# Patient Record
Sex: Female | Born: 1985 | Race: White | Hispanic: No | Smoking: Never smoker
Health system: Southern US, Community
[De-identification: ages and names within clinical notes are randomized; demographics above are authoritative.]

## PROBLEM LIST (undated history)

## (undated) DIAGNOSIS — J45909 Unspecified asthma, uncomplicated: Secondary | ICD-10-CM

## (undated) DIAGNOSIS — F32A Depression, unspecified: Secondary | ICD-10-CM

## (undated) DIAGNOSIS — F329 Major depressive disorder, single episode, unspecified: Secondary | ICD-10-CM

## (undated) HISTORY — PX: TONSILLECTOMY: SUR1361

## (undated) HISTORY — PX: RENAL CYST EXCISION: SUR506

## (undated) HISTORY — PX: ADENOIDECTOMY: SUR15

---

## 2010-09-08 ENCOUNTER — Emergency Department: Payer: Self-pay | Admitting: Emergency Medicine

## 2011-01-20 ENCOUNTER — Emergency Department: Payer: Self-pay

## 2011-10-14 ENCOUNTER — Emergency Department: Payer: Self-pay | Admitting: Unknown Physician Specialty

## 2012-07-30 ENCOUNTER — Ambulatory Visit: Payer: Self-pay | Admitting: Family Medicine

## 2012-08-05 ENCOUNTER — Ambulatory Visit: Payer: Self-pay | Admitting: Unknown Physician Specialty

## 2012-08-29 ENCOUNTER — Emergency Department: Payer: Self-pay | Admitting: Emergency Medicine

## 2012-09-08 ENCOUNTER — Emergency Department: Payer: Self-pay | Admitting: Emergency Medicine

## 2013-01-06 ENCOUNTER — Emergency Department: Payer: Self-pay | Admitting: Emergency Medicine

## 2013-01-07 LAB — URINALYSIS, COMPLETE
Bilirubin,UR: NEGATIVE
Blood: NEGATIVE
Glucose,UR: NEGATIVE mg/dL (ref 0–75)
Protein: NEGATIVE
Squamous Epithelial: 3

## 2013-01-07 LAB — COMPREHENSIVE METABOLIC PANEL
Alkaline Phosphatase: 136 U/L (ref 50–136)
Anion Gap: 4 — ABNORMAL LOW (ref 7–16)
BUN: 10 mg/dL (ref 7–18)
Bilirubin,Total: 0.4 mg/dL (ref 0.2–1.0)
Calcium, Total: 9.4 mg/dL (ref 8.5–10.1)
Chloride: 108 mmol/L — ABNORMAL HIGH (ref 98–107)
Creatinine: 0.98 mg/dL (ref 0.60–1.30)
EGFR (Non-African Amer.): >60
Glucose: 87 mg/dL (ref 65–99)
SGPT (ALT): 26 U/L (ref 12–78)
Sodium: 140 mmol/L (ref 136–145)
Total Protein: 7.3 g/dL (ref 6.4–8.2)

## 2013-01-07 LAB — CBC
HCT: 38 % (ref 35.0–47.0)
HGB: 13.2 g/dL (ref 12.0–16.0)
MCH: 30.3 pg (ref 26.0–34.0)

## 2013-01-07 LAB — TROPONIN I: Troponin-I: <0.02 ng/mL

## 2013-02-03 ENCOUNTER — Ambulatory Visit: Payer: Self-pay | Admitting: Gastroenterology

## 2013-02-17 ENCOUNTER — Emergency Department: Payer: Self-pay | Admitting: Emergency Medicine

## 2013-02-17 LAB — URINALYSIS, COMPLETE
Bilirubin,UR: NEGATIVE
Glucose,UR: NEGATIVE mg/dL (ref 0–75)
Ph: 5 (ref 4.5–8.0)
Protein: NEGATIVE
Specific Gravity: 1.028 (ref 1.003–1.030)
Squamous Epithelial: 8

## 2013-02-17 LAB — COMPREHENSIVE METABOLIC PANEL
Albumin: 3.5 g/dL (ref 3.4–5.0)
Anion Gap: 5 — ABNORMAL LOW (ref 7–16)
BUN: 11 mg/dL (ref 7–18)
Calcium, Total: 9 mg/dL (ref 8.5–10.1)
Chloride: 107 mmol/L (ref 98–107)
Co2: 24 mmol/L (ref 21–32)
Creatinine: 0.92 mg/dL (ref 0.60–1.30)
EGFR (Non-African Amer.): >60
Osmolality: 271 (ref 275–301)
Potassium: 3.3 mmol/L — ABNORMAL LOW (ref 3.5–5.1)
SGOT(AST): 30 U/L (ref 15–37)
Sodium: 136 mmol/L (ref 136–145)

## 2013-02-17 LAB — CBC
HCT: 41.5 % (ref 35.0–47.0)
MCH: 29 pg (ref 26.0–34.0)
MCHC: 33.1 g/dL (ref 32.0–36.0)
MCV: 88 fL (ref 80–100)
Platelet: 170 10*3/uL (ref 150–440)
RBC: 4.74 10*6/uL (ref 3.80–5.20)
RDW: 13.1 % (ref 11.5–14.5)
WBC: 10.8 10*3/uL (ref 3.6–11.0)

## 2013-07-25 ENCOUNTER — Emergency Department (HOSPITAL_BASED_OUTPATIENT_CLINIC_OR_DEPARTMENT_OTHER): Payer: Worker's Compensation

## 2013-07-25 ENCOUNTER — Encounter (HOSPITAL_BASED_OUTPATIENT_CLINIC_OR_DEPARTMENT_OTHER): Payer: Self-pay | Admitting: Emergency Medicine

## 2013-07-25 ENCOUNTER — Emergency Department (HOSPITAL_BASED_OUTPATIENT_CLINIC_OR_DEPARTMENT_OTHER)
Admission: EM | Admit: 2013-07-25 | Discharge: 2013-07-25 | Disposition: A | Discharge status: Home / Self Care | Payer: Worker's Compensation | Attending: Emergency Medicine | Admitting: Emergency Medicine

## 2013-07-25 DIAGNOSIS — Y9239 Other specified sports and athletic area as the place of occurrence of the external cause: Secondary | ICD-10-CM | POA: Insufficient documentation

## 2013-07-25 DIAGNOSIS — S0510XA Contusion of eyeball and orbital tissues, unspecified eye, initial encounter: Secondary | ICD-10-CM | POA: Insufficient documentation

## 2013-07-25 DIAGNOSIS — S0003XA Contusion of scalp, initial encounter: Secondary | ICD-10-CM | POA: Insufficient documentation

## 2013-07-25 DIAGNOSIS — S0990XA Unspecified injury of head, initial encounter: Secondary | ICD-10-CM | POA: Insufficient documentation

## 2013-07-25 DIAGNOSIS — Z79899 Other long term (current) drug therapy: Secondary | ICD-10-CM | POA: Insufficient documentation

## 2013-07-25 DIAGNOSIS — F329 Major depressive disorder, single episode, unspecified: Secondary | ICD-10-CM | POA: Insufficient documentation

## 2013-07-25 DIAGNOSIS — S1093XA Contusion of unspecified part of neck, initial encounter: Secondary | ICD-10-CM

## 2013-07-25 DIAGNOSIS — S0083XA Contusion of other part of head, initial encounter: Secondary | ICD-10-CM | POA: Insufficient documentation

## 2013-07-25 DIAGNOSIS — S060XAA Concussion with loss of consciousness status unknown, initial encounter: Secondary | ICD-10-CM | POA: Insufficient documentation

## 2013-07-25 DIAGNOSIS — F3289 Other specified depressive episodes: Secondary | ICD-10-CM | POA: Insufficient documentation

## 2013-07-25 DIAGNOSIS — W219XXA Striking against or struck by unspecified sports equipment, initial encounter: Secondary | ICD-10-CM | POA: Insufficient documentation

## 2013-07-25 DIAGNOSIS — J45909 Unspecified asthma, uncomplicated: Secondary | ICD-10-CM | POA: Insufficient documentation

## 2013-07-25 DIAGNOSIS — Y92838 Other recreation area as the place of occurrence of the external cause: Secondary | ICD-10-CM

## 2013-07-25 DIAGNOSIS — S060X9A Concussion with loss of consciousness of unspecified duration, initial encounter: Secondary | ICD-10-CM

## 2013-07-25 DIAGNOSIS — Y9367 Activity, basketball: Secondary | ICD-10-CM | POA: Insufficient documentation

## 2013-07-25 HISTORY — DX: Unspecified asthma, uncomplicated: J45.909

## 2013-07-25 HISTORY — DX: Depression, unspecified: F32.A

## 2013-07-25 HISTORY — DX: Major depressive disorder, single episode, unspecified: F32.9

## 2013-07-25 MED ORDER — ONDANSETRON 4 MG PO TBDP: 4.0000 mg | ORAL_TABLET | Freq: Three times a day (TID) | ORAL | Status: DC | PRN

## 2013-07-25 MED ORDER — NAPROXEN 500 MG PO TABS: 500.0000 mg | ORAL_TABLET | Freq: Two times a day (BID) | ORAL | Status: DC

## 2013-07-25 NOTE — Discharge Instructions (Signed)
Concussion, Adult  A concussion is a brain injury. It is caused by:  · A hit to the head.  · A quick and sudden movement (jolt) of the head or neck.  A concussion is usually not life-threatening. Even so, it can cause serious problems. If you had a concussion before, you may have concussion-like problems after a hit to your head.  HOME CARE  General Instructions  · Follow your doctor's directions carefully.  · Take medicines only as told by your doctor.  · Only take medicines your doctor says are safe.  · Do not drink alcohol until your doctor says it is OK. Alcohol and some drugs can slow down healing. They can also put you at risk for further injury.  · If your are having trouble remembering things, write them down.  · Try to do one thing at a time if you get distracted easily. For example, do not watch TV while making dinner.  · Talk to your family members or close friends when making important decisions.  · Follow up with your doctor as told.  · Watch your symptoms. Tell others to do the same. Serious problems can sometimes happen after a concussion. Older adults are more likely to have these problems.  · Tell your teachers, school nurse, school counselor, coach, athletic trainer, or work manager about your concussion. Tell them about what you can or cannot do. They should watch to see if:  · It gets even harder for you to pay attention or concentrate.  · It gets even harder for you to remember things or learn new things.  · You need more time than normal to finish things.  · You become annoyed (irritable) more than before.  · You are not able to deal with stress as well.  · You have more problems than before.  · Rest. Make sure you:  · Get plenty of sleep at night.  · Go to sleep early.  · Go to bed at the same time every day. Try to wake up at the same time.  · Rest during the day.  · Take naps when you feel tired.  · Limit activities where you have to think a lot or concentrate. These include:  · Doing  homework.  · Doing work related to a job.  · Watching TV.  · Using the computer.  Returning To Your Regular Activities  Return to your normal activities slowly, not all at once. You must give your body and brain enough time to heal.   · Do not play sports or do other athletic activities until your doctor says it is OK.  · Ask your doctor when you can drive, ride a bicycle, or work other vehicles or machines. Never do these things if you feel dizzy.  · Ask your doctor about when you can return to work or school.  Preventing Another Concussion  It is very important to avoid another brain injury, especially before you have healed. In rare cases, another injury can lead to permanent brain damage, brain swelling, or death. The risk of this is greatest during the first 7 10 after your injury. Avoid injuries by:   · Wearing a seat belt when riding in a car.  · Not drinking too much alcohol.  · Avoiding activities that could lead to a second concussion (such as contact sports).  · Wearing a helmet when doing activities like:  · Biking.  · Skiing.  · Skateboarding.  · Skating.  ·   Making your home safer by:  · Removing things from the floor or stairways that could make you trip.  · Using grab bars in bathrooms and handrails by stairs.  · Placing non-slip mats on floors and in bathtubs.  · Improve lighting in dark areas.  GET HELP IF:  · It gets even harder for you to pay attention or concentrate.  · It gets even harder for you to remember things or learn new things.  · You need more time than normal to finish things.  · You become annoyed (irritable) more than before.  · You are not able to deal with stress as well.  · You have more problems than before.  · You have problems keeping your balance.  · You are not able to react quickly when you should.  Get help if you have any of these problems for more than 2 weeks:   · Lasting (chronic) headaches.  · Dizziness or trouble balancing.  · Feeling sick to your stomach  (nausea).  · Seeing (vision) problems.  · Being affected by noises or light more than normal.  · Feeling sad, low, down in the dumps, blue, gloomy, or empty (depressed).  · Mood changes (mood swings).  · Feeling of fear or nervousness about what may happen (anxiety).  · Feeling annoyed.  · Memory problems.  · Problems concentrating or paying attention.  · Sleep problems.  · Feeling tired all the time.  GET HELP RIGHT AWAY IF:   · You have bad headaches or your headaches get worse.  · You have weakness (even if it is in one hand, leg, or part of the face).  · You have loss of feeling (numbness).  · You feel off balance.  · You keep throwing up (vomiting).  · You feel tired.  · One black center of your eye (pupil) is larger than the other.  · You twitch or shake violently (convulse).  · Your speech is not clear (slurred).  · You are more confused, easily angered (agitated), or annoyed than before.  · You have more trouble resting than before.  · You are unable to recognize people or places.  · You have neck pain.  · It is difficult to wake you up.  · You have unusual behavior changes.  · You pass out (lose consciousness).  MAKE SURE YOU:   · Understand these instructions.  · Will watch your condition.  · Will get help right away if you are not doing well or get worse.  Document Released: 02/01/2009 Document Revised: 12/04/2012 Document Reviewed: 09/05/2012  [employer]® Patient Information ©2014 [employer].

## 2013-07-25 NOTE — ED Notes (Signed)
Pt c/o head injury x 2 hrs ago , hit to head by baskettball, c/o dizziness

## 2013-07-25 NOTE — ED Provider Notes (Signed)
CSN: [id number]     Arrival date & time 07/25/13  1412 History   First MD Initiated Contact with Patient 07/25/13 1506     Chief Complaint  Patient presents with  . Head Injury     (Consider location/radiation/quality/duration/timing/severity/associated sxs/prior Treatment) Patient is a 28 y.o. female presenting with head injury. The history is provided by the patient.  Head Injury Associated symptoms: headache   Associated symptoms: no nausea, no neck pain, no tinnitus and no vomiting    patient was at work. Patient was struck by basketball on the right for head right face area. No loss of consciousness but had dizziness. Has some mild discomfort. But no vision changes. The eye pain is very mild. Patient also has a mild headache. No neck pain. No other injuries.  Past Medical History  Diagnosis Date  . Asthma   . Depressed    Past Surgical History  Procedure Laterality Date  . Tonsillectomy     History reviewed. No pertinent family history. History  Substance Use Topics  . Smoking status: Never Smoker   . Smokeless tobacco: Not on file  . Alcohol Use: No   OB History   Grav Para Term Preterm Abortions TAB SAB Ect Mult Living                 Review of Systems  Constitutional: Negative for fever.  HENT: Positive for facial swelling. Negative for congestion, ear pain, nosebleeds, tinnitus, trouble swallowing and voice change.   Eyes: Positive for pain. Negative for photophobia, redness and visual disturbance.  Respiratory: Negative for shortness of breath.   Cardiovascular: Negative for chest pain.  Gastrointestinal: Negative for nausea, vomiting and abdominal pain.  Genitourinary: Negative for dysuria.  Musculoskeletal: Negative for back pain and neck pain.  Skin: Negative for rash and wound.  Neurological: Positive for dizziness and headaches. Negative for weakness.  Hematological: Does not bruise/bleed easily.  Psychiatric/Behavioral: Negative for confusion.       Allergies  Ceclor and Erythromycin  Home Medications   Prior to Admission medications   Medication Sig Start Date End Date Taking? Authorizing Provider  albuterol (PROVENTIL) (2.5 MG/3ML) 0.083% nebulizer solution Take 2.5 mg by nebulization every 6 (six) hours as needed for wheezing or shortness of breath.   Yes Historical Provider, MD  buPROPion (WELLBUTRIN XL) 300 MG 24 hr tablet Take 300 mg by mouth daily.   Yes Historical Provider, MD  naproxen (NAPROSYN) 500 MG tablet Take 1 tablet (500 mg total) by mouth 2 (two) times daily. 07/25/13   Vanetta Mulders, MD  ondansetron (ZOFRAN ODT) 4 MG disintegrating tablet Take 1 tablet (4 mg total) by mouth every 8 (eight) hours as needed for nausea or vomiting. 07/25/13   Vanetta Mulders, MD   BP 148/90  Pulse 73  Temp(Src) 98.5 F (36.9 C)  Resp 16  Ht 5\' 7"  (1.702 m)  Wt 280 lb (127.007 kg)  BMI 43.84 kg/m2  SpO2 100%  LMP 07/16/2013 Physical Exam  Nursing note and vitals reviewed. Constitutional: She is oriented to person, place, and time. She appears well-developed and well-nourished. No distress.  HENT:  Head: Normocephalic.  Mouth/Throat: Oropharynx is clear and moist.  Mild tenderness to palpation underneath the right eye.  Eyes: Conjunctivae and EOM are normal. Pupils are equal, round, and reactive to light.  No hyphema. No ecchymosis.  Neck: Normal range of motion. Neck supple.  Cardiovascular: Normal rate, regular rhythm and intact distal pulses.   Pulmonary/Chest: Effort normal and breath  sounds normal. No respiratory distress.  Abdominal: Soft. Bowel sounds are normal. There is no tenderness.  Musculoskeletal: Normal range of motion. She exhibits no tenderness.  Neurological: She is alert and oriented to person, place, and time. No cranial nerve deficit. She exhibits normal muscle tone. Coordination normal.  Skin: Skin is warm.    ED Course  Procedures (including critical care time) Labs Review Labs Reviewed -  No data to display  Imaging Review Ct Head Wo Contrast  07/25/2013   CLINICAL DATA:  Hit on right face with basketball.  EXAM: CT HEAD WITHOUT CONTRAST  CT MAXILLOFACIAL WITHOUT CONTRAST  TECHNIQUE: Multidetector CT imaging of the head and maxillofacial structures were performed using the standard protocol without intravenous contrast. Multiplanar CT image reconstructions of the maxillofacial structures were also generated.  COMPARISON:  None.  FINDINGS: CT HEAD FINDINGS  The ventricles and sulci are normal. No intraparenchymal hemorrhage, mass effect nor midline shift. No acute large vascular territory infarcts.  No abnormal extra-axial fluid collections. Basal cisterns are patent. No skull fracture.  No skull fracture. The included ocular globes and orbital contents are non-suspicious. The mastoid aircells and included paranasal sinuses are well-aerated. Partial volume fluid density in the sella.  CT MAXILLOFACIAL FINDINGS  The mandible is intact, the condyles are located. No facial fracture. Orbital walls and rims intact.  No destructive bony lesions. Paranasal sinuses and mastoid air cells are well aerated. Nasal septum slightly deviated to the right. Bilateral accessory parotid tissue, normal.  Ocular globes and orbital contents are unremarkable. Soft tissues are nonsuspicious.  IMPRESSION: CT head: No acute intracranial process ; normal noncontrast CT of the.  CT maxillofacial:  No facial fracture.   Electronically Signed   By: Awilda Metroourtnay  Bloomer   On: 07/25/2013 16:25   Ct Maxillofacial Wo Cm  07/25/2013   CLINICAL DATA:  Hit on right face with basketball.  EXAM: CT HEAD WITHOUT CONTRAST  CT MAXILLOFACIAL WITHOUT CONTRAST  TECHNIQUE: Multidetector CT imaging of the head and maxillofacial structures were performed using the standard protocol without intravenous contrast. Multiplanar CT image reconstructions of the maxillofacial structures were also generated.  COMPARISON:  None.  FINDINGS: CT HEAD  FINDINGS  The ventricles and sulci are normal. No intraparenchymal hemorrhage, mass effect nor midline shift. No acute large vascular territory infarcts.  No abnormal extra-axial fluid collections. Basal cisterns are patent. No skull fracture.  No skull fracture. The included ocular globes and orbital contents are non-suspicious. The mastoid aircells and included paranasal sinuses are well-aerated. Partial volume fluid density in the sella.  CT MAXILLOFACIAL FINDINGS  The mandible is intact, the condyles are located. No facial fracture. Orbital walls and rims intact.  No destructive bony lesions. Paranasal sinuses and mastoid air cells are well aerated. Nasal septum slightly deviated to the right. Bilateral accessory parotid tissue, normal.  Ocular globes and orbital contents are unremarkable. Soft tissues are nonsuspicious.  IMPRESSION: CT head: No acute intracranial process ; normal noncontrast CT of the.  CT maxillofacial:  No facial fracture.   Electronically Signed   By: Awilda Metroourtnay  Bloomer   On: 07/25/2013 16:25     EKG Interpretation None      MDM   Final diagnoses:  Head injury  Concussion  Facial contusion    Patient status post head injury hit by basketball on the right side of the face and head no loss of consciousness. It was dizzy. CT of head and face without any acute injuries. Based on patient's symptoms has a  mild concussion. This occurred at work. Workmen's Comp. paperwork completed. No neck pain. No other injuries.    Vanetta Mulders, MD 07/25/13 1655

## 2013-12-05 ENCOUNTER — Emergency Department: Payer: Self-pay | Admitting: Emergency Medicine

## 2014-03-10 ENCOUNTER — Emergency Department: Payer: Self-pay | Admitting: Emergency Medicine

## 2014-03-10 LAB — COMPREHENSIVE METABOLIC PANEL
ANION GAP: 7 (ref 7–16)
Albumin: 3.2 g/dL — ABNORMAL LOW (ref 3.4–5.0)
Alkaline Phosphatase: 90 U/L
BUN: 10 mg/dL (ref 7–18)
Bilirubin,Total: 0.2 mg/dL (ref 0.2–1.0)
CALCIUM: 8.8 mg/dL (ref 8.5–10.1)
CHLORIDE: 109 mmol/L — AB (ref 98–107)
CO2: 25 mmol/L (ref 21–32)
Creatinine: 0.96 mg/dL (ref 0.60–1.30)
EGFR (Non-African Amer.): >60
GLUCOSE: 86 mg/dL (ref 65–99)
OSMOLALITY: 280 (ref 275–301)
POTASSIUM: 3.5 mmol/L (ref 3.5–5.1)
SGOT(AST): 16 U/L (ref 15–37)
SGPT (ALT): 16 U/L
SODIUM: 141 mmol/L (ref 136–145)
Total Protein: 7.2 g/dL (ref 6.4–8.2)

## 2014-03-10 LAB — TROPONIN I

## 2014-03-10 LAB — CBC
HCT: 40.7 % (ref 35.0–47.0)
HGB: 13.3 g/dL (ref 12.0–16.0)
MCH: 29.4 pg (ref 26.0–34.0)
MCHC: 32.7 g/dL (ref 32.0–36.0)
MCV: 90 fL (ref 80–100)
PLATELETS: 246 10*3/uL (ref 150–440)
RBC: 4.53 10*6/uL (ref 3.80–5.20)
RDW: 13.4 % (ref 11.5–14.5)
WBC: 8.7 10*3/uL (ref 3.6–11.0)

## 2014-03-10 LAB — D-DIMER(ARMC): D-Dimer: 270 ng/ml

## 2014-03-19 ENCOUNTER — Encounter (INDEPENDENT_AMBULATORY_CARE_PROVIDER_SITE_OTHER): Payer: Self-pay

## 2014-03-19 ENCOUNTER — Encounter: Payer: Self-pay | Admitting: Cardiovascular Disease

## 2014-03-19 ENCOUNTER — Ambulatory Visit (INDEPENDENT_AMBULATORY_CARE_PROVIDER_SITE_OTHER): Payer: BLUE CROSS/BLUE SHIELD | Admitting: Cardiovascular Disease

## 2014-03-19 VITALS — BP 139/99 | HR 75 | Ht 67.0 in | Wt 314.0 lb

## 2014-03-19 DIAGNOSIS — R002 Palpitations: Secondary | ICD-10-CM

## 2014-03-19 DIAGNOSIS — R0602 Shortness of breath: Secondary | ICD-10-CM

## 2014-03-19 DIAGNOSIS — R000 Tachycardia, unspecified: Secondary | ICD-10-CM

## 2014-03-19 NOTE — Assessment & Plan Note (Signed)
The patient reports increased episodes of palpitations almost on a daily basis. Some of her symptoms are suggestive of SVT and others of premature beats. The episodes are happening almost on a daily basis and thus I requested a 48-hour Holter monitor. Stress might be contributing to some of her symptoms. She reports no symptoms suggestive of sleep apnea. She does have chronic exertional dyspnea and thus I requested an echocardiogram to ensure no structural heart abnormalities.

## 2014-03-19 NOTE — Progress Notes (Signed)
HPI  This is a 29 year old female who was referred from the emergency room at [facility] for evaluation of palpitations. She has no previous cardiac history. She has known history of asthma and obesity. She has been having increased episodes of palpitations described as sudden increase in heart rate associated with shortness of breath and dizziness. No chest discomfort. No syncope or presyncope. There is no family history of coronary artery disease or arrhythmia. She is not a smoker and does not consume excessive amounts of caffeine. She went to the emergency room at [facility] where basic workup was unremarkable. She reports that these episodes are happening almost on a daily basis. She has been under stress lately due to increased demands of work.  Allergies  Allergen Reactions  . Ceclor [Cefaclor]   . Erythromycin      Current Outpatient Prescriptions on File Prior to Visit  Medication Sig Dispense Refill  . albuterol (PROVENTIL) (2.5 MG/3ML) 0.083% nebulizer solution Take 2.5 mg by nebulization every 6 (six) hours as needed for wheezing or shortness of breath.     No current facility-administered medications on file prior to visit.     Past Medical History  Diagnosis Date  . Asthma   . Depressed      Past Surgical History  Procedure Laterality Date  . Tonsillectomy    . Adenoidectomy    . Renal cyst excision       Family History  Problem Relation Age of Onset  . Heart attack Mother   . Hypertension Mother   . Hyperlipidemia Mother      History   Social History  . Marital Status: Unknown    Spouse Name: N/A    Number of Children: N/A  . Years of Education: N/A   Occupational History  . Not on file.   Social History Main Topics  . Smoking status: Never Smoker   . Smokeless tobacco: Not on file  . Alcohol Use: Yes     Comment: occasional  . Drug Use: No  . Sexual Activity: No   Other Topics Concern  . Not on file   Social History Narrative     ROS A 10  point review of system was performed. It is negative other than that mentioned in the history of present illness.   PHYSICAL EXAM   BP 139/99 mmHg  Pulse 75  Ht 5\' 7"  (1.702 m)  Wt 314 lb (142.429 kg)  BMI 49.17 kg/m2 Constitutional: She is oriented to person, place, and time. She appears well-developed and well-nourished. No distress.  HENT: No nasal discharge.  Head: Normocephalic and atraumatic.  Eyes: Pupils are equal and round. No discharge.  Neck: Normal range of motion. Neck supple. No JVD present. No thyromegaly present.  Cardiovascular: Normal rate, regular rhythm, normal heart sounds. Exam reveals no gallop and no friction rub. No murmur heard.  Pulmonary/Chest: Effort normal and breath sounds normal. No stridor. No respiratory distress. She has no wheezes. She has no rales. She exhibits no tenderness.  Abdominal: Soft. Bowel sounds are normal. She exhibits no distension. There is no tenderness. There is no rebound and no guarding.  Musculoskeletal: Normal range of motion. She exhibits no edema and no tenderness.  Neurological: She is alert and oriented to person, place, and time. Coordination normal.  Skin: Skin is warm and dry. No rash noted. She is not diaphoretic. No erythema. No pallor.  Psychiatric: She has a normal mood and affect. Her behavior is normal. Judgment and thought content  normal.     [id number]  Rhythm  Low voltage in precordial leads.   ABNORMAL    ASSESSMENT AND PLAN

## 2014-03-19 NOTE — Patient Instructions (Signed)
Your physician has recommended that you wear a holter monitor. Holter monitors are medical devices that record the heart's electrical activity. Doctors most often use these monitors to diagnose arrhythmias. Arrhythmias are problems with the speed or rhythm of the heartbeat. The monitor is a small, portable device. You can wear one while you do your normal daily activities. This is usually used to diagnose what is causing palpitations/syncope (passing out).   Your physician has requested that you have an echocardiogram. Echocardiography is a painless test that uses sound waves to create images of your heart. It provides your doctor with information about the size and shape of your heart and how well your heart's chambers and valves are working. This procedure takes approximately one hour. There are no restrictions for this procedure.  Your physician recommends that you schedule a follow-up appointment in:  As needed   

## 2014-03-20 ENCOUNTER — Ambulatory Visit: Payer: Self-pay | Admitting: Cardiovascular Disease

## 2014-04-06 ENCOUNTER — Other Ambulatory Visit: Payer: Self-pay

## 2014-04-20 ENCOUNTER — Other Ambulatory Visit: Payer: Self-pay

## 2014-04-20 DIAGNOSIS — R0989 Other specified symptoms and signs involving the circulatory and respiratory systems: Secondary | ICD-10-CM

## 2014-04-21 ENCOUNTER — Encounter: Payer: Self-pay | Admitting: *Deleted

## 2015-02-05 IMAGING — CR DG CHEST 2V
1 series · 2 of 2 positions shown · non-contrast
Comparison: Chest radiograph performed 10/14/2011

CLINICAL DATA: Acute onset of left-sided chest pain and shortness
of breath. Generalized headache and tachycardia. Initial encounter.

EXAM:
CHEST  2 VIEW

[Series 1: dxr chest pa (or ap) and lateral · 0.14mm/px · 2 of 2 slices shown]
[im 1/2]
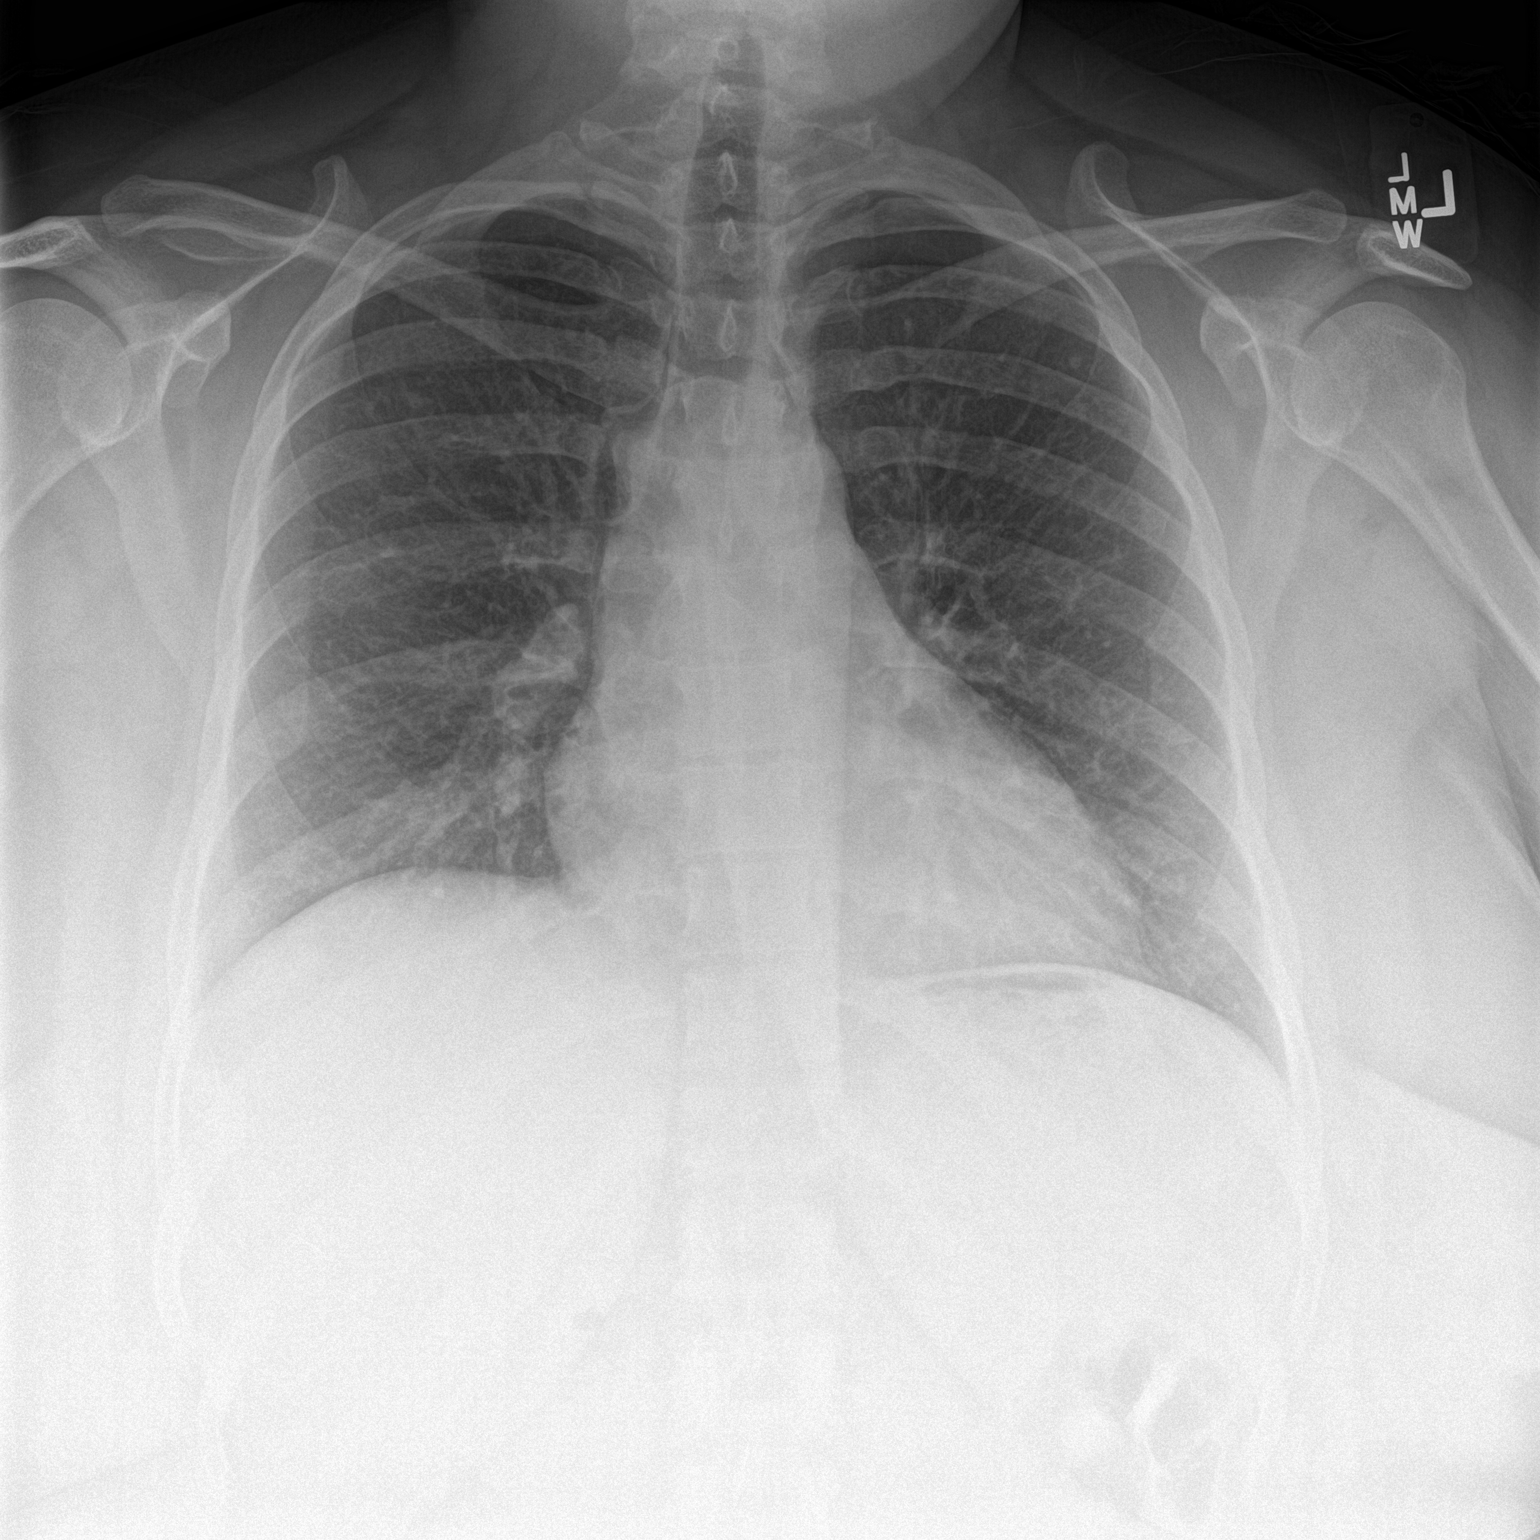
[im 2/2]
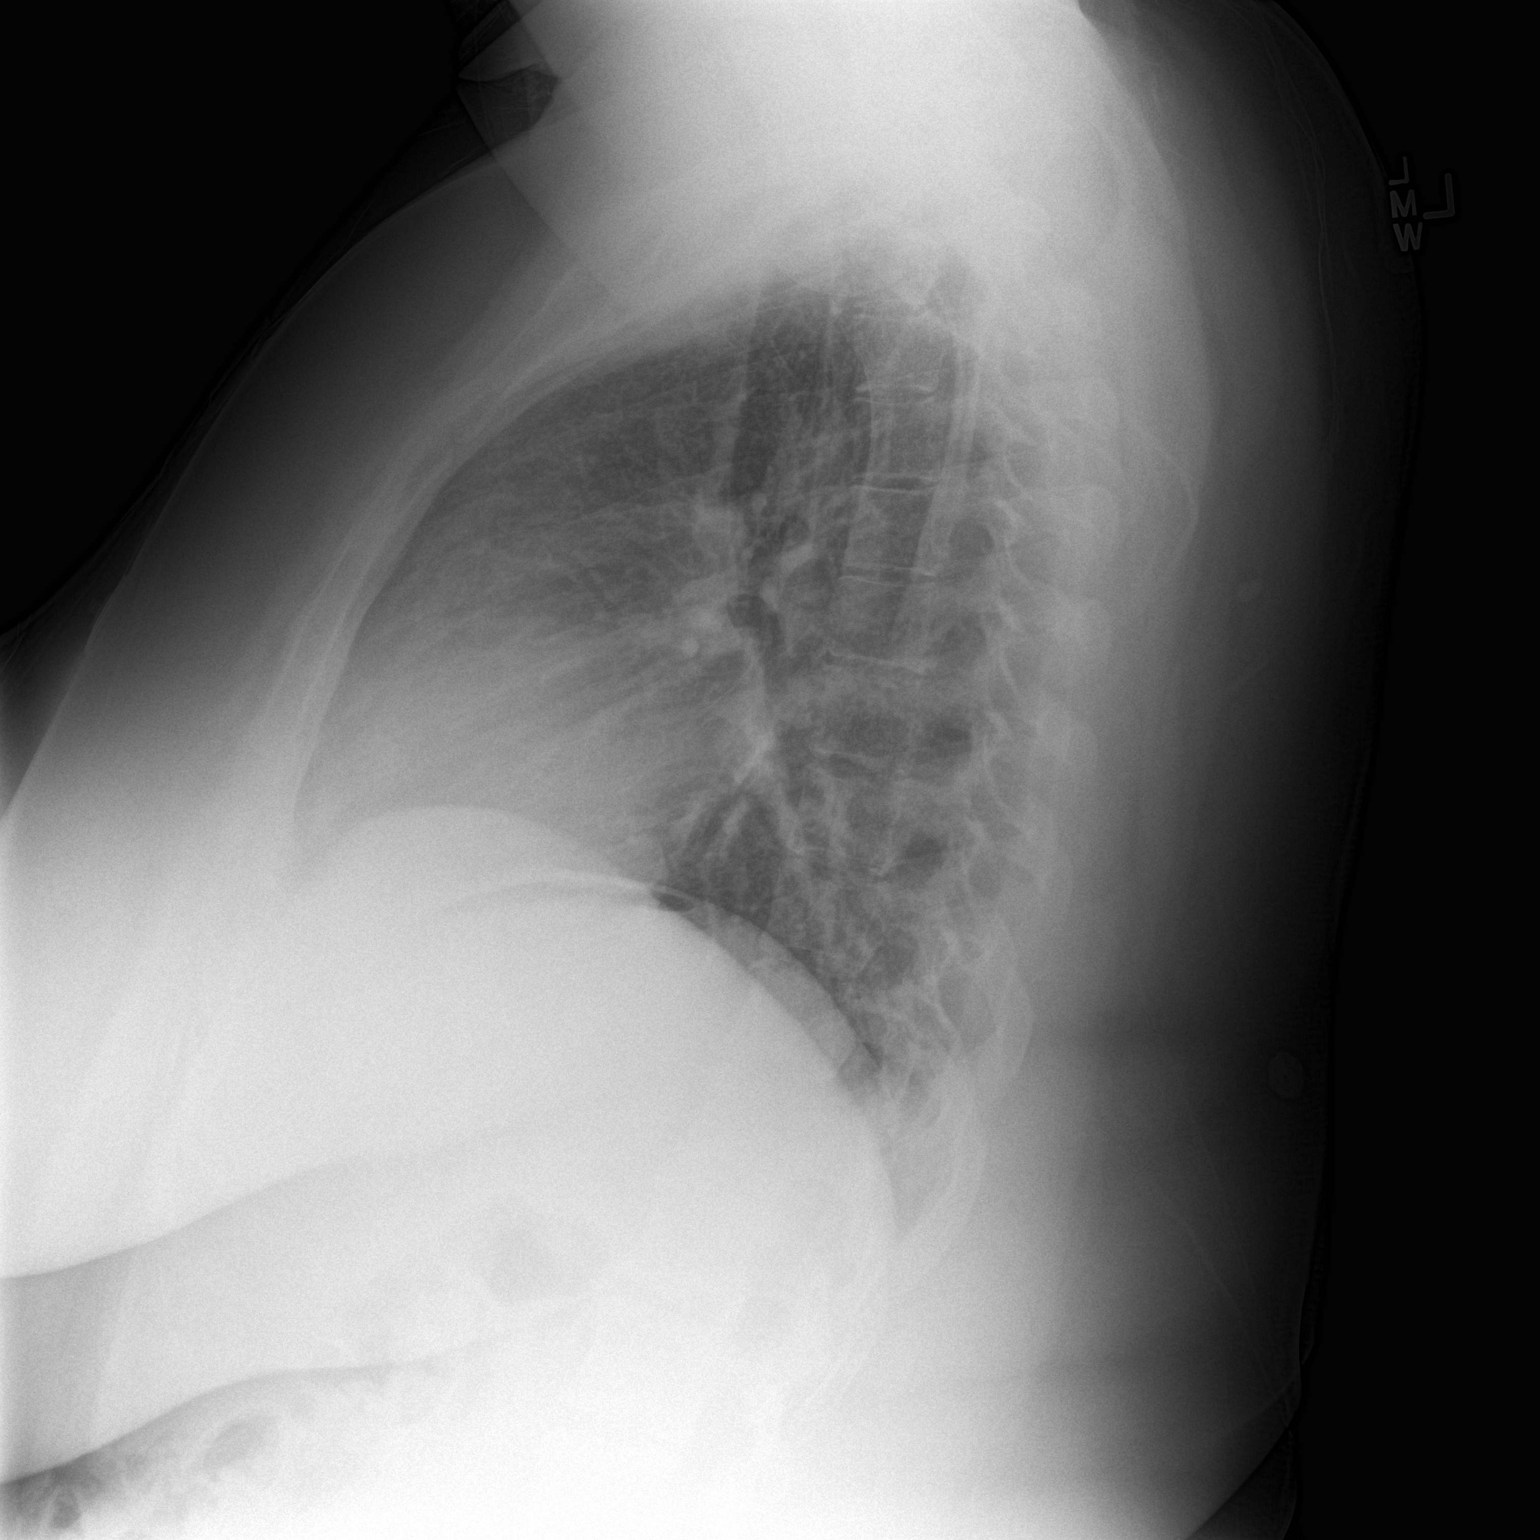

[2 of 2 positions shown; findings below may reference images not displayed]

FINDINGS: The lungs are well-aerated and clear. There is no evidence of focal
opacification, pleural effusion or pneumothorax.

The heart is borderline normal in size; the mediastinal contour is
within normal limits. No acute osseous abnormalities are seen.
IMPRESSION: No acute cardiopulmonary process seen.

## 2016-09-08 ENCOUNTER — Ambulatory Visit: Payer: BC Managed Care – PPO | Attending: Obstetrics and Gynecology | Admitting: Physical Therapy

## 2016-09-22 ENCOUNTER — Ambulatory Visit: Payer: BC Managed Care – PPO | Admitting: Physical Therapy

## 2016-09-29 ENCOUNTER — Ambulatory Visit: Payer: Self-pay | Admitting: Physical Therapy

## 2016-09-29 ENCOUNTER — Ambulatory Visit: Payer: BC Managed Care – PPO | Admitting: Physical Therapy

## 2016-10-12 ENCOUNTER — Ambulatory Visit: Payer: BC Managed Care – PPO | Admitting: Physical Therapy

## 2016-10-26 ENCOUNTER — Ambulatory Visit: Payer: BC Managed Care – PPO | Attending: Obstetrics and Gynecology | Admitting: Physical Therapy

## 2016-10-26 ENCOUNTER — Encounter: Payer: Self-pay | Admitting: Physical Therapy

## 2016-10-26 DIAGNOSIS — M2170 Unequal limb length (acquired), unspecified site: Secondary | ICD-10-CM

## 2016-10-26 DIAGNOSIS — M533 Sacrococcygeal disorders, not elsewhere classified: Secondary | ICD-10-CM

## 2016-10-26 DIAGNOSIS — M4125 Other idiopathic scoliosis, thoracolumbar region: Secondary | ICD-10-CM

## 2016-10-26 DIAGNOSIS — M6281 Muscle weakness (generalized): Secondary | ICD-10-CM

## 2016-10-26 NOTE — Therapy (Signed)
[facility] [address] [place], [place], [address] Phone: [phone]   Fax:  [phone]  Physical Therapy Treatment  Patient Details  Name: Sierra EngelsDanielle Harvell Arnold MRN: [id number] Date of Birth: 04/08/1985 Referring Provider: Dalbert GarnetBeasley  Encounter Date: 10/26/2016      PT End of Session - 10/26/16 1534    Visit Number 1   Number of Visits 12   Date for PT Re-Evaluation 01/17/17   PT Start Time 1434   PT Stop Time 1525   PT Time Calculation (min) 51 min      Past Medical History:  Diagnosis Date  . Asthma   . Depressed     Past Surgical History:  Procedure Laterality Date  . ADENOIDECTOMY    . RENAL CYST EXCISION    . TONSILLECTOMY      There were no vitals filed for this visit.      Subjective Assessment - 10/26/16 1442    Subjective Pt has always had painful menstrual pain and in 2011, pt had surgery ( D & C, removed renal cyst). The pain improved 90% better. The pain started to bother her again 5 years ago. Now the week before and during her cycle is when the pain occurs. Pt is on continuous birth cycle helps her to not have the cycle and helps wih the pain.  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 In March 2018, the pain returned and back pain acocmpanied the the low abdominal pain. The pain is located in the tailbone area and it can shoot around to the L side to the pubic pain.  The endometriosis is ont he left side and pt has been told her L hip is higher than the R side. Pt has been told slight scoliosis.  Current pain: 5/10 because this is the week it should start but she will not bleed due to the birt control pill. At worst 10/10.  At its best. 1/10 easing with heat  and when she is not on her cycle.    Pertinent History When she was in 5th grade, she sprained her back due to heavy book pack. Pt has fallen onto her tailbone when skating.    Patient Stated Goals  to ease this pain             [facility] PT Assessment - 10/26/16 1456      Assessment   Medical Diagnosis pelvic pain   Referring Provider Dalbert GarnetBeasley     Precautions   Precautions None     Restrictions   Weight Bearing Restrictions No     Balance Screen   Has the patient fallen in the past 6 months No     Prior Function   Level of Independence Independent     Observation/Other Assessments   Observations RLE ASIS to medial malleoli 86cm, L 87 cm       Posture/Postural Control   Posture Comments hypextended knees in stand     AROM   Overall AROM Comments ~20 deg rotation L,      Palpation   SI assessment  L SIJ hypomobility into hip ext ( improved post Tx0   Palpation comment R PSIS lower      Ambulation/Gait   Gait Comments pre Tx: R lateral pelvic shift on L stance phase, Post Tx: less  Pelvic Floor Special Questions - 10/26/16 1558    Diastasis Recti neg   External Perineal Exam through clothing, tenderness and tensions at L coccygeus/ transverse perineal/ ischiocavernosus           [facility] Adult PT Treatment/Exercise - 10/26/16 1512      Therapeutic Activites    Therapeutic Activities --  provided shoe lift into R shoe     Exercises   Exercises --  see pt instruction     Manual Therapy   Manual therapy comments L long axis distraction, inferior/ superifio mob, MWM with PA mob at L SIJ with hip abd / ER                      PT Long Term Goals - 10/26/16 1449      PT LONG TERM GOAL #1   Title Pt will not need to take a heating pad with her to work in order to demo IND with pain management    Time 12   Period Weeks   Status New   Target Date 01/18/17     PT LONG TERM GOAL #2   Title Pt will decrease her PDI score from 53% to < 48% in order to improve QOL   Time 12   Period Weeks   Status New   Target Date 01/18/17     PT LONG TERM GOAL #3   Title Pt will decrease her ODI score from 30 % to < 25% in order  to improve ADLs   Time 12   Period Weeks   Status New   Target Date 01/18/17     PT LONG TERM GOAL #4   Title Pt will demo no pelvic asymmetries in standing with shoe lift in R shoe and no pelvic floor tensions/ tendenress at L occygeus/sichiocavernosus/transverse perineal   across 2 visits in order to minimize pain   Time 4   Period Weeks   Status New   Target Date 11/23/16               Plan - 10/26/16 1543    Clinical Impression Statement Pt is a 31 yo who reports pelvic and CLBP assocaited with her menstrual cycle. Pt has been Dx with endormetriosis on her L side where her radaiting pain from the tailbone resides and travels to the anterior pelvic region. These Sx impact her QOL. Pt 's clinical presentations include pelvic obliquties, leg length difference, limited L SIJ mobility, and pelvic floor mm tightness 2/2 to scoliosis. Pt also showed hyperextension of knees and deep core weakness. Following Tx today, pt showed increased L SIJ mobility into hip extension,. Provided pt shoe heel in L shoe which helped to decrease pt's pain as well.        Clinical Presentation Evolving   Clinical Decision Making Moderate   Rehab Potential Good   PT Frequency 1x / week   PT Duration 12 weeks   PT Treatment/Interventions ADLs/Self Care Home Management;Functional mobility training;Stair training;Moist Heat;Therapeutic activities;Patient/family education;Manual techniques;Neuromuscular re-education;Balance training;Therapeutic exercise;Gait training   Consulted and Agree with Plan of Care Patient      Patient will benefit from skilled therapeutic intervention in order to improve the following deficits and impairments:  Pain, Decreased strength, Decreased mobility, Decreased range of motion, Decreased coordination, Decreased safety awareness, Impaired flexibility, Improper body mechanics, Increased muscle spasms, Hypomobility, Decreased endurance, Abnormal gait, Obesity  Visit  Diagnosis: Other idiopathic scoliosis, thoracolumbar region  Muscle weakness (generalized)  Sacrococcygeal  disorders, not elsewhere classified  Leg length discrepancy     Problem List Patient Active Problem List   Diagnosis Date Noted  . Palpitation 03/19/2014    Mariane Masters ,PT, DPT, E-RYT  10/26/2016, 4:00 PM  [place] [facility] MAIN [facility] SERVICES [address] [place], [place], [address] Phone: [phone]   Fax:  [phone]  Name: Sierra Arnold MRN: [id number] Date of Birth: 1985/11/15

## 2016-10-26 NOTE — Patient Instructions (Signed)
   Standing:  10 reps on both sides x 3 x day     3 point tap   Feet are hip width Tap forward, center under hip not feet next to each other  Tap middle\, center  Tap back       _Figure-4 and then toe touch to the ground behind you along a diagonal   

## 2016-11-08 ENCOUNTER — Ambulatory Visit: Payer: BC Managed Care – PPO | Admitting: Physical Therapy

## 2016-11-08 ENCOUNTER — Encounter: Payer: Self-pay | Admitting: Physical Therapy

## 2016-12-11 ENCOUNTER — Ambulatory Visit: Payer: BC Managed Care – PPO | Attending: Obstetrics and Gynecology | Admitting: Physical Therapy

## 2016-12-12 ENCOUNTER — Encounter: Payer: Self-pay | Admitting: Physical Therapy

## 2016-12-12 DIAGNOSIS — M2170 Unequal limb length (acquired), unspecified site: Secondary | ICD-10-CM

## 2016-12-12 DIAGNOSIS — M4125 Other idiopathic scoliosis, thoracolumbar region: Secondary | ICD-10-CM

## 2016-12-12 DIAGNOSIS — M533 Sacrococcygeal disorders, not elsewhere classified: Secondary | ICD-10-CM

## 2016-12-12 DIAGNOSIS — M6281 Muscle weakness (generalized): Secondary | ICD-10-CM

## 2016-12-12 NOTE — Therapy (Signed)
[facility] MAIN [facility] SERVICES [address] [place], [place], [address] Phone: [phone]   Fax:  [phone]  December 12, 2016    Physical Therapy Discharge Summary  Patient: Sierra Arnold  MRN: [id number]  Date of Birth: [birth year]   Diagnosis: Muscle weakness (generalized)  Sacrococcygeal disorders, not elsewhere classified  Other idiopathic scoliosis, thoracolumbar region  Leg length discrepancy Referring Provider: Dalbert Garnet  The above patient had been seen in Physical Therapy for 1 eval and no-showed/ cancelled a total of 4 appts.   Pt is d/c due to non-compliance.   Sincerely,   Mariane Masters, PT ,PT, DPT, E-RYT     [facility] MAIN [facility] SERVICES [address] [place], [place], [address] Phone: [phone]   Fax:  [phone]  Patient: Sierra Arnold  MRN: [id number]  Date of Birth: 07/20/1985

## 2023-09-17 ENCOUNTER — Ambulatory Visit: Payer: Self-pay | Admitting: Podiatry

## 2023-09-17 ENCOUNTER — Ambulatory Visit (INDEPENDENT_AMBULATORY_CARE_PROVIDER_SITE_OTHER)

## 2023-09-17 VITALS — Ht 67.0 in | Wt 312.0 lb

## 2023-09-17 DIAGNOSIS — M21612 Bunion of left foot: Secondary | ICD-10-CM

## 2023-09-17 DIAGNOSIS — M722 Plantar fascial fibromatosis: Secondary | ICD-10-CM

## 2023-09-17 DIAGNOSIS — M2141 Flat foot [pes planus] (acquired), right foot: Secondary | ICD-10-CM | POA: Diagnosis not present

## 2023-09-17 DIAGNOSIS — M2142 Flat foot [pes planus] (acquired), left foot: Secondary | ICD-10-CM

## 2023-09-17 DIAGNOSIS — M2012 Hallux valgus (acquired), left foot: Secondary | ICD-10-CM

## 2023-09-17 NOTE — Progress Notes (Signed)
 Subjective:  Patient ID: Sierra Arnold, female    DOB: 07/13/1985,  MRN: [id number]  Chief Complaint  Patient presents with   Plantar Fasciitis    RM 9 Patient here for possible plantar fascitis of the left foot. Patient states pain in left heel that radiates towards the bottom of the foot and bunion area. Patient describes a dull achy pain at a 4. Patient has tried cold therapy and elevation.    Discussed the use of AI scribe software for clinical note transcription with the patient, who gave verbal consent to proceed.  History of Present Illness Amneet Cendejas is a 38 year old female with a history of arthritis and bariatric surgery who presents with heel and foot pain.  She has been experiencing heel and foot pain since March, affecting the entire foot. The pain is exacerbated by activity and described as shooting across the ankle. No recent injuries are reported, but there is a history of foot fractures in her youth.  She has been using compression and ice with minimal relief. Tylenol is used for pain management, but NSAIDs are avoided due to her bariatric surgery. Muscle rubs are applied at night for additional relief.  She has a history of arthritis since age 23 and is currently managing diabetes. Increased bunion pain has been noted since losing weight, attributed to reduced padding and increased activity. She regularly walks about a mile, which may contribute to the pain.  There is a family history of bunions, possibly from her mother. 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      Objective:    Physical Exam VASCULAR: DP and PT pulse palpable. Foot is warm and well-perfused. Capillary fill time is brisk. DERMATOLOGIC: Normal skin turgor, texture, and temperature. No open lesions, rashes, or ulcerations. NEUROLOGIC: Normal sensation to light touch and pressure. No paresthesias. ORTHOPEDIC: Pain  on palpation of plantar midarch and along peroneal tendons and peroneus longus. Hallux valgus deformity and pes planus deformity. Smooth pain-free range of motion of all examined joints. No ecchymosis or bruising. No gross deformity.   No images are attached to the encounter.    Results RADIOLOGY Foot X-ray: Moderate hallux valgus deformity and pes planus (09/16/2021)   Assessment:   1. Plantar fascial fibromatosis   2. Hallux valgus with bunions, left   3. Pes planus of both feet      Plan:  Patient was evaluated and treated and all questions answered.  Assessment and Plan Assessment & Plan Plantar fasciitis Chronic plantar fasciitis since March, with pain in the midarch, exacerbated by pes planus. No heel spur present. Limited treatment options due to inability to use NSAIDs post-bariatric surgery. - Provide a walking boot to allow rest and healing of soft tissues. - Recommend use of Voltaren gel up to four times daily for anti-inflammatory effect, with minimal systemic absorption. - Advise against NSAIDs due to bariatric surgery history. - Consider prednisone taper if necessary, pending approval from bariatric team. - Recommend physical therapy for strengthening and stretching exercises. - Suggest long-term use of orthotics or insoles for prevention.  Pes planus Pes planus contributing to plantar fasciitis by stretching the plantar fascia and causing inflammation. - Recommend long-term use of orthotics or insoles for arch support and prevention of plantar fasciitis recurrence. - Consider custom orthotics with potential lifts for leg length discrepancy.  Peroneal tendonitis Peroneal tendonitis with pain along the peroneal tendons and peroneus  longus, likely contributing to the pulling sensation across the foot. Limited treatment options due to inability to use NSAIDs post-bariatric surgery. - Provide a walking boot to allow rest and healing of soft tissues. - Recommend use  of Voltaren gel up to four times daily for anti-inflammatory effect, with minimal systemic absorption. - Advise against NSAIDs due to bariatric surgery history. - Consider prednisone taper if necessary, pending approval from bariatric team. - Recommend physical therapy for strengthening and stretching exercises.  Hallux valgus Moderate hallux valgus deformity with associated pain, likely due to pressure on the bone and nerve, exacerbated by increased activity and weight loss. - Recommend evaluating footwear for soft, stretchy materials and roomy toe boxes. - Suggest use of a bunion shield gel pad to protect the nerve and maintain toe alignment. - Consider surgical intervention if conservative measures fail, with discussion of time and recovery if needed.      Return in about 1 month (around 10/18/2023) for recheck plantar fasciitis.

## 2023-09-17 NOTE — Patient Instructions (Addendum)
 VISIT SUMMARY: Today, we discussed your heel and foot pain, which has been ongoing since March. We reviewed your history of arthritis, diabetes, and previous bariatric surgery, and how these conditions may be contributing to your current symptoms. We also considered your increased activity level and weight loss as factors in your foot pain. We developed a comprehensive plan to address your plantar fasciitis, pes planus, peroneal tendonitis, and hallux valgus.  YOUR PLAN: -PLANTAR FASCIITIS: Plantar fasciitis is inflammation of the tissue along the bottom of your foot, causing heel pain. We will provide you with a walking boot to help rest and heal the soft tissues. You can use Voltaren gel up to four times daily for its anti-inflammatory effect. Avoid NSAIDs due to your bariatric surgery history. If necessary, we may consider a prednisone taper, pending approval from your bariatric team. Physical therapy is recommended for strengthening and stretching exercises, and long-term use of orthotics or insoles is suggested for prevention.  -PES PLANUS: Pes planus, or flat feet, can stretch the plantar fascia and cause inflammation, contributing to your plantar fasciitis. We recommend the long-term use of orthotics or insoles for arch support to prevent recurrence. Custom orthotics with potential lifts for leg length discrepancy may also be considered.  -PERONEAL TENDONITIS: Peroneal tendonitis is inflammation of the tendons on the outer side of your ankle, causing pain. We will provide you with a walking boot to help rest and heal the soft tissues. You can use Voltaren gel up to four times daily for its anti-inflammatory effect. Avoid NSAIDs due to your bariatric surgery history. If necessary, we may consider a prednisone taper, pending approval from your bariatric team. Physical therapy is recommended for strengthening and stretching exercises.  -HALLUX VALGUS: Hallux valgus, commonly known as a bunion, is a  deformity of the big toe joint causing pain. We recommend evaluating your footwear to ensure they are made of soft, stretchy materials with roomy toe boxes. Using a bunion shield gel pad can help protect the nerve and maintain toe alignment. If conservative measures fail, we may consider surgical intervention, and we will discuss the time and recovery involved if needed.  INSTRUCTIONS: Please follow up with physical therapy for strengthening and stretching exercises. Use the walking boot as directed and apply Voltaren gel up to four times daily. Avoid NSAIDs due to your bariatric surgery history. If your symptoms do not improve, we may consider a prednisone taper, pending approval from your bariatric team. Consider long-term use of orthotics or insoles for prevention, and evaluate your footwear for soft, stretchy materials and roomy toe boxes. If conservative measures for your bunion do not provide relief, we may discuss surgical options.                      Contains text generated by [employer].                                 Contains text generated by [employer].   Look for Voltaren gel at the pharmacy over the counter or online (also known as diclofenac 1% gel). Apply to the painful areas 3-4x daily with the supplied dosing card. Allow to dry for 10 minutes before going into socks/shoes   Plantar Fasciitis (Heel Spur Syndrome) with Rehab The plantar fascia is a fibrous, ligament-like, soft-tissue structure that spans the bottom of the foot. Plantar fasciitis is a condition that causes pain in the foot due to  inflammation of the tissue. SYMPTOMS  Pain and tenderness on the underneath side of the foot. Pain that worsens with standing or walking. CAUSES  Plantar fasciitis is caused by irritation and injury to the plantar fascia on the underneath side of the foot. Common mechanisms of injury include: Direct trauma to bottom of the foot. Damage to a  small nerve that runs under the foot where the main fascia attaches to the heel bone. Stress placed on the plantar fascia due to bone spurs. RISK INCREASES WITH:  Activities that place stress on the plantar fascia (running, jumping, pivoting, or cutting). Poor strength and flexibility. Improperly fitted shoes. Tight calf muscles. Flat feet. Failure to warm-up properly before activity. Obesity. PREVENTION Warm up and stretch properly before activity. Allow for adequate recovery between workouts. Maintain physical fitness: Strength, flexibility, and endurance. Cardiovascular fitness. Maintain a health body weight. Avoid stress on the plantar fascia. Wear properly fitted shoes, including arch supports for individuals who have flat feet.  PROGNOSIS  If treated properly, then the symptoms of plantar fasciitis usually resolve without surgery. However, occasionally surgery is necessary.  RELATED COMPLICATIONS  Recurrent symptoms that may result in a chronic condition. Problems of the lower back that are caused by compensating for the injury, such as limping. Pain or weakness of the foot during push-off following surgery. Chronic inflammation, scarring, and partial or complete fascia tear, occurring more often from repeated injections.  TREATMENT  Treatment initially involves the use of ice and medication to help reduce pain and inflammation. The use of strengthening and stretching exercises may help reduce pain with activity, especially stretches of the Achilles tendon. These exercises may be performed at home or with a therapist. Your caregiver may recommend that you use heel cups of arch supports to help reduce stress on the plantar fascia. Occasionally, corticosteroid injections are given to reduce inflammation. If symptoms persist for greater than 6 months despite non-surgical (conservative), then surgery may be recommended.   MEDICATION  If pain medication is necessary, then  nonsteroidal anti-inflammatory medications, such as aspirin and ibuprofen, or other minor pain relievers, such as acetaminophen, are often recommended. Do not take pain medication within 7 days before surgery. Prescription pain relievers may be given if deemed necessary by your caregiver. Use only as directed and only as much as you need. Corticosteroid injections may be given by your caregiver. These injections should be reserved for the most serious cases, because they may only be given a certain number of times.  HEAT AND COLD Cold treatment (icing) relieves pain and reduces inflammation. Cold treatment should be applied for 10 to 15 minutes every 2 to 3 hours for inflammation and pain and immediately after any activity that aggravates your symptoms. Use ice packs or massage the area with a piece of ice (ice massage). Heat treatment may be used prior to performing the stretching and strengthening activities prescribed by your caregiver, physical therapist, or athletic trainer. Use a heat pack or soak the injury in warm water.  SEEK IMMEDIATE MEDICAL CARE IF: Treatment seems to offer no benefit, or the condition worsens. Any medications produce adverse side effects.  EXERCISES- RANGE OF MOTION (ROM) AND STRETCHING EXERCISES - Plantar Fasciitis (Heel Spur Syndrome) These exercises may help you when beginning to rehabilitate your injury. Your symptoms may resolve with or without further involvement from your physician, physical therapist or athletic trainer. While completing these exercises, remember:  Restoring tissue flexibility helps normal motion to return to the joints. This allows healthier,  less painful movement and activity. An effective stretch should be held for at least 30 seconds. A stretch should never be painful. You should only feel a gentle lengthening or release in the stretched tissue.  RANGE OF MOTION - Toe Extension, Flexion Sit with your right / left leg crossed over your  opposite knee. Grasp your toes and gently pull them back toward the top of your foot. You should feel a stretch on the bottom of your toes and/or foot. Hold this stretch for 10 seconds. Now, gently pull your toes toward the bottom of your foot. You should feel a stretch on the top of your toes and or foot. Hold this stretch for 10 seconds. Repeat  times. Complete this stretch 3 times per day.   RANGE OF MOTION - Ankle Dorsiflexion, Active Assisted Remove shoes and sit on a chair that is preferably not on a carpeted surface. Place right / left foot under knee. Extend your opposite leg for support. Keeping your heel down, slide your right / left foot back toward the chair until you feel a stretch at your ankle or calf. If you do not feel a stretch, slide your bottom forward to the edge of the chair, while still keeping your heel down. Hold this stretch for 10 seconds. Repeat 3 times. Complete this stretch 2 times per day.   STRETCH  Gastroc, Standing Place hands on wall. Extend right / left leg, keeping the front knee somewhat bent. Slightly point your toes inward on your back foot. Keeping your right / left heel on the floor and your knee straight, shift your weight toward the wall, not allowing your back to arch. You should feel a gentle stretch in the right / left calf. Hold this position for 10 seconds. Repeat 3 times. Complete this stretch 2 times per day.  STRETCH  Soleus, Standing Place hands on wall. Extend right / left leg, keeping the other knee somewhat bent. Slightly point your toes inward on your back foot. Keep your right / left heel on the floor, bend your back knee, and slightly shift your weight over the back leg so that you feel a gentle stretch deep in your back calf. Hold this position for 10 seconds. Repeat 3 times. Complete this stretch 2 times per day.  STRETCH  Gastrocsoleus, Standing  Note: This exercise can place a lot of stress on your foot and ankle. Please  complete this exercise only if specifically instructed by your caregiver.  Place the ball of your right / left foot on a step, keeping your other foot firmly on the same step. Hold on to the wall or a rail for balance. Slowly lift your other foot, allowing your body weight to press your heel down over the edge of the step. You should feel a stretch in your right / left calf. Hold this position for 10 seconds. Repeat this exercise with a slight bend in your right / left knee. Repeat 3 times. Complete this stretch 2 times per day.   STRENGTHENING EXERCISES - Plantar Fasciitis (Heel Spur Syndrome)  These exercises may help you when beginning to rehabilitate your injury. They may resolve your symptoms with or without further involvement from your physician, physical therapist or athletic trainer. While completing these exercises, remember:  Muscles can gain both the endurance and the strength needed for everyday activities through controlled exercises. Complete these exercises as instructed by your physician, physical therapist or athletic trainer. Progress the resistance and repetitions only as guided.  STRENGTH - Towel Curls Sit in a chair positioned on a non-carpeted surface. Place your foot on a towel, keeping your heel on the floor. Pull the towel toward your heel by only curling your toes. Keep your heel on the floor. Repeat 3 times. Complete this exercise 2 times per day.  STRENGTH - Ankle Inversion Secure one end of a rubber exercise band/tubing to a fixed object (table, pole). Loop the other end around your foot just before your toes. Place your fists between your knees. This will focus your strengthening at your ankle. Slowly, pull your big toe up and in, making sure the band/tubing is positioned to resist the entire motion. Hold this position for 10 seconds. Have your muscles resist the band/tubing as it slowly pulls your foot back to the starting position. Repeat 3 times. Complete  this exercises 2 times per day.  Document Released: 02/13/2005 Document Revised: 05/08/2011 Document Reviewed: 05/28/2008 [facility] Patient Information 2014 [place], [place].

## 2023-09-18 ENCOUNTER — Encounter: Payer: Self-pay | Admitting: Podiatry

## 2023-09-18 MED ORDER — METHYLPREDNISOLONE 4 MG PO TBPK: ORAL_TABLET | ORAL | 0 refills | Status: AC

## 2023-10-15 ENCOUNTER — Ambulatory Visit: Admitting: Podiatry

## 2023-10-15 ENCOUNTER — Telehealth: Payer: Self-pay

## 2023-10-15 NOTE — Telephone Encounter (Signed)
 Lvm to reschedule 11/02/23 appointment.. provider out of office

## 2023-10-31 ENCOUNTER — Ambulatory Visit: Admitting: Podiatry

## 2023-10-31 ENCOUNTER — Encounter: Payer: Self-pay | Admitting: Podiatry

## 2023-10-31 DIAGNOSIS — M7672 Peroneal tendinitis, left leg: Secondary | ICD-10-CM | POA: Diagnosis not present

## 2023-10-31 NOTE — Progress Notes (Signed)
  Subjective:  Patient ID: Sierra Arnold, female    DOB: January 14, 1986,  MRN: [id number]  Chief Complaint  Patient presents with   Ankle Pain    She states the plantar fa   Plantar Fasciitis    She states the plantar fasciitis is better. States severe ankle pain on the right ankle. Wears her boot as instructed.    38 y.o. female presents with the above complaint. History confirmed with patient.  Her heel pain is doing better but still having pain on the outside of the ankle  Objective:  Physical Exam: warm, good capillary refill, no trophic changes or ulcerative lesions, normal DP and PT pulses, normal sensory exam, and today mainly pain on the peroneal tendons worse on the peroneus longus between the peroneal tubercle and fibula  Assessment:   1. Peroneal tendinitis of left lower leg      Plan:  Patient was evaluated and treated and all questions answered.  Plantar fasciitis is doing better.  I recommended corticosteroid injection for the peroneal tendinitis.  Suspect tenosynovitis.  Following a discussion of the risks and benefits and the recovery process and consent the lateral ankle was cleansed with alcohol and 4 mg of dexamethasone  and 0.5 cc of 0.5% Marcaine plain was injected into the peroneal tendon sheath at the site of maximal tenderness.  I recommend she remain in the boot for 3 more weeks.  Return in 6 weeks to reevaluate.  If she is unable to transition comfortably out of the boot in 3 weeks then I would recommend an MRI to evaluate for a tear and she will let me know if this is the case.  Regarding her hallux valgus deformity I suspect she likely will need a Lapidus bunionectomy.  We briefly discussed the recovery process for this and she will plan for this in the next summer   Return in about 6 weeks (around 12/12/2023) for re-check peroneal tendinitis.

## 2023-11-02 ENCOUNTER — Other Ambulatory Visit

## 2023-11-12 ENCOUNTER — Encounter: Payer: Self-pay | Admitting: Podiatry

## 2023-11-12 DIAGNOSIS — M7672 Peroneal tendinitis, left leg: Secondary | ICD-10-CM

## 2023-11-16 ENCOUNTER — Ambulatory Visit (INDEPENDENT_AMBULATORY_CARE_PROVIDER_SITE_OTHER)

## 2023-11-16 ENCOUNTER — Telehealth: Payer: Self-pay

## 2023-11-16 DIAGNOSIS — M2012 Hallux valgus (acquired), left foot: Secondary | ICD-10-CM

## 2023-11-16 DIAGNOSIS — M7672 Peroneal tendinitis, left leg: Secondary | ICD-10-CM | POA: Diagnosis not present

## 2023-11-16 DIAGNOSIS — M2141 Flat foot [pes planus] (acquired), right foot: Secondary | ICD-10-CM

## 2023-11-16 DIAGNOSIS — M722 Plantar fascial fibromatosis: Secondary | ICD-10-CM

## 2023-11-16 DIAGNOSIS — M2142 Flat foot [pes planus] (acquired), left foot: Secondary | ICD-10-CM

## 2023-11-16 DIAGNOSIS — M21612 Bunion of left foot: Secondary | ICD-10-CM

## 2023-11-16 NOTE — Telephone Encounter (Signed)
 Patient was given new cam walker medium boot 11/15/2023 due to hole and cracked bottom

## 2023-11-19 NOTE — Progress Notes (Signed)
 Orthotics   Patient was present and evaluated for Custom molded foot orthotics. Patient will benefit from CFO's to provide total contact to BIL MLA's helping to balance and distribute body weight more evenly across BIL feet helping to reduce plantar pressure and pain. Orthotic will also encourage FF / RF alignment  Patient was scanned today and will return for fitting upon receipt

## 2023-11-20 NOTE — Telephone Encounter (Signed)
 Left message advised to continue to wear boot.

## 2023-11-28 ENCOUNTER — Encounter: Payer: Self-pay | Admitting: Podiatry

## 2023-12-05 ENCOUNTER — Ambulatory Visit
Admission: RE | Admit: 2023-12-05 | Discharge: 2023-12-05 | Disposition: A | Discharge status: Home / Self Care | Source: Ambulatory Visit | Attending: Podiatry | Admitting: Podiatry

## 2023-12-05 DIAGNOSIS — M7672 Peroneal tendinitis, left leg: Secondary | ICD-10-CM

## 2023-12-06 ENCOUNTER — Encounter: Payer: Self-pay | Admitting: Podiatry

## 2023-12-12 ENCOUNTER — Ambulatory Visit: Admitting: Podiatry

## 2023-12-12 VITALS — Ht 67.0 in | Wt 312.0 lb

## 2023-12-12 DIAGNOSIS — M7672 Peroneal tendinitis, left leg: Secondary | ICD-10-CM

## 2023-12-12 NOTE — Patient Instructions (Signed)
 Chronic Ankle Instability & Rehab Chronic Ankle Instability Chronic ankle instability is a condition that makes the ankle weak and more likely to give way. The condition is common among athletes, especially those with prior ankle ligament injury. Ligaments are strong tissues that connectbones to each other. What are the causes?  This condition is caused by multiple ankle sprains that have not healedproperly, leaving the ankle ligaments loose or damaged. What increases the risk? This condition is more likely to develop in people who participate in sports in which there is a risk of spraining an ankle. These sports include: Cross-country trail running. Basketball. Baseball. Tennis. Football. Soccer. What are the signs or symptoms? Symptoms of this condition include: Rolling your ankle repeatedly. Swelling. Pain. Bruising. Tenderness. Feeling wobbly or unsteady on your foot. Difficulty walking on uneven surfaces or in the dark. How is this diagnosed? This condition may be diagnosed based on: Your symptoms. Your medical history. A physical exam. Your health care provider will check your balance, strength, and range of motion. He or she will also check your injured ankle against your healthy ankle. Imaging tests, such as: An X-ray. A CT scan. An MRI. An ultrasound. How is this treated? Treatment for this condition may include: Wearing a removable boot, brace, or splint. Wearing supportive shoes or shoe inserts. Applying ice to the ankle to reduce swelling. Taking anti-inflammatory pain medicine. Doing exercises (physical therapy). Not putting any body weight, or putting only limited body weight, on your ankle for several days. Gradually returning to full activity. Surgery to repair damaged ligaments. Usually, surgery is needed only if the condition is severe or if othertreatments do not work. Follow these instructions at home: If you have a boot, brace, or splint: Wear it  as told by your health care provider. Remove it only as told by your health care provider. Loosen it if your toes tingle, become numb, or turn cold and blue. Keep it clean. If it is not waterproof: Do not let it get wet. Cover it with a watertight covering when you take a bath or a shower. Ask your health care provider when it is safe to drive with a boot, brace, or splint on your foot. Managing pain, stiffness, and swelling  If directed, put ice on the injured area. If you have a removable boot, brace, or splint, remove it as told by your health care provider. Put ice in a plastic bag. Place a towel between your skin and the bag. Leave the ice on for 20 minutes, 2-3 times a day. Move your toes, foot, and ankle often to reduce stiffness and swelling. Raise (elevate) the injured area above the level of your heart while you are sitting or lying down.  Activity Return to your normal activities as told by your health care provider. Ask your health care provider what activities are safe for you. Do not put your full body weight on your ankle until your health care provider says that you can. Do not do any activities that make pain or swelling worse. Do exercises as told by your health care provider. General instructions Take over-the-counter and prescription medicines only as told by your health care provider. Wear supportive shoes or inserts as told by your health care provider. Keep all follow-up visits as told by your health care provider. This is important. How is this prevented? Wear supportive footwear that is appropriate for your athletic activity. Avoid athletic activities that cause pain or swelling in your ankle. See your health  care provider if you have an ankle sprain that causes pain and swelling for more than 2-4 weeks. Do ankle range-of-motion and strengthening exercises as told by your health care provider before beginning any athletic activity. If you start a new athletic  activity, start gradually to build up your strength and flexibility. Contact a health care provider if: Your condition is not getting better after 2-4 weeks of treatment. You cannot put body weight on your ankle without feeling more pain. Summary Chronic ankle instability is a condition that makes the ankle weak and more likely to give way. This condition is caused by multiple ankle sprains that have not healed properly, leaving the ankle ligaments loose or damaged. Treatment includes wearing a boot, brace, or splint, taking medicines for pain and inflammation, and using ice on the affected area. Follow your health care provider's instructions for caring for your ankle during recovery. Contact your health care provider if your ankle does not get better in 2-4 weeks, or if you cannot put weight on your ankle without feeling more pain. This information is not intended to replace advice given to you by your health care provider. Make sure you discuss any questions you have with your healthcare provider. Document Revised: 11/27/2017 Document Reviewed: 11/27/2017 Elsevier Patient Education  2022 [employer]    Ask your health care provider which exercises are safe for you. Do exercises exactly as told by your health care provider and adjust them as directed. It is normal to feel mild stretching, pulling, tightness, or discomfort as you do these exercises. Stop right away if you feel sudden pain or your pain gets worse. Do not begin these exercises until told by your health care provider. Strengthening exercises These exercises build strength and endurance in your ankle. Endurance is theability to use your muscles for a long time, even after they get tired. Ankle eversion Sit on the floor with your legs straight out in front of you. Loop a rubber exercise band around the ball of your left / right foot. The ball of your foot is on the walking surface, right under your toes. Hold the ends of the band  in your hands, or secure the band to a stable object. Slowly push your foot outward, away from your other leg (eversion). Hold this position for 10 seconds. Slowly return your foot to the starting position. Repeat 10 times. Complete this exercise 2 times a day. Heel walking  This exercise strengthens the muscles that push the ankle backward to point your toes toward your knee (ankle dorsiflexors). Walk on your heels for 10 ft. Keep your toes as high as possible. Repeat 10 times. Complete this exercise 2  times per day. Toe walking  This exercise strengthens the muscles that push the ankle forward and your toes downward (ankle plantar flexors). Walk on your toes for 10 ft. Keep your heels as high as possible. Repeat 10  times. Complete this exercise 2  times per day. Balance exercises These exercises improve or maintain your balance. Balance is important inimproving ankle stability and preventing falls. Tandem walking Do this exercise in a hallway or room that is at least 10 ft (3 m) long. Stand with one foot directly in front of the other (tandem). You can use the walls to help you balance if needed, but try not to use them for support. Slowly lift your back foot and place it directly in front of your other foot. Continue to walk in this heel-to-toe way for 10  ft. Repeat 10  times. Complete this exercise 2  times a day. Single leg stand Without shoes, stand near a railing or in a doorway. You may hold on to the railing or door frame as needed for balance. Stand on your left / right foot. Keep your big toe down on the floor and try to keep your arch lifted. If this is too easy, you can try doing it while you do one of these actions: Keep your eyes closed. Stand on a pillow. Throw a ball against a wall and catch it when it returns. Hold this position for 10 seconds. Repeat 10 times. Complete this exercise 2 times a day. Ankle inversion and ankle eversion This exercise is also called  foot rotation with a balance board. It uses a balance board to rotate the foot and ankle inward (inversion) and outward (eversion). Ask your health care provider where you can get a balance board or how you can make one. Stand on a non-carpeted surface near a countertop or wall. Step onto the balance board so your feet are hip width apart. Keep your feet in place, and keep your upper body and hips steady. Using only your feet and ankles to move the board, do the following exercises: Tip the board from side to side as far as you can, alternating between tipping to the left and tipping to the right. Tip the board so it silently taps the floor. Do not let the board forcefully hit the floor. From time to time, pause to hold a steady midway position, with neither the right nor the left sides touching the ground. Tip the board side to side so the board does not hit the floor at all. From time to time, pause to hold a steady midway position. Repeat 10 times, pausing from time to time to hold a steady position.Complete this exercise 2 times a day. Ankle plantar flexion and ankle dorsiflexion This exercise is also called foot flexion with a balance board. It uses a balance board to push the foot downward and away from the leg (plantar flexion) or upward and toward the leg (dorsiflexion). Ask your health care provider where you can get a balance board or how you can make one. Stand on a non-carpeted surface near a countertop or wall. Step onto the balance board so your feet are hip width apart. Keep your feet in place, and keep your upper body and hips steady. Using only your feet and ankles, do the following exercises: Tip the board forward and backward so the board silently taps the floor. Do not let the board forcefully hit the floor. From time to time, pause to hold a steady position midway between touching the floor in front and touching the floor in back. Tip the board forward and backward so the board  does not hit the floor at all. From time to time, pause to hold a steady position. Repeat 10 times, pausing from time to time to hold a steady position.Complete this exercise 2 times a day. This information is not intended to replace advice given to you by your health care provider. Make sure you discuss any questions you have with your healthcare provider. Document Revised: 06/06/2018 Document Reviewed: 11/27/2017 Elsevier Patient Education  2022 [employer]    Peroneal Tendinopathy Rehab Ask your health care provider which exercises are safe for you. Do exercises exactly as told by your health care provider and adjust them as directed. It is normal to feel mild stretching, pulling,  tightness, or discomfort as you do these exercises. Stop right away if you feel sudden pain or your pain gets worse. Do not begin these exercises until told by your health care provider. Stretching and range-of-motion exercises These exercises warm up your muscles and joints and improve the movement and flexibility of your ankle. These exercises also help to relieve pain and stiffness. Gastroc and soleus stretch, standing  This is an exercise in which you stand on a step and use your body weight to stretch your calf muscles. To do this exercise: Stand on the edge of a step on the ball of your left / right foot. The ball of your foot is on the walking surface, right under your toes. Keep your other foot firmly on the same step. Hold on to the wall, a railing, or a chair for balance. Slowly lift your other foot, allowing your body weight to press your left / right heel down over the edge of the step. You should feel a stretch in your left / right calf (gastrocnemius and soleus). Hold this position for 15 seconds. Return both feet to the step. Repeat this exercise with a slight bend in your left / right knee. Repeat 5 times with your left / right knee straight and 5 times with your left / right knee bent. Complete  this exercise 2 times a day. Strengthening exercises These exercises build strength and endurance in your foot and ankle. Endurance is the ability to use your muscles for a long time, even after they get tired. Ankle dorsiflexion with band   Secure a rubber exercise band or tube to an object, such as a table leg, that will not move when the band is pulled. Secure the other end of the band around your left / right foot. Sit on the floor, facing the object with your left / right leg extended. The band or tube should be slightly tense when your foot is relaxed. Slowly flex your left / right ankle and toes to bring your foot toward you (dorsiflexion). Hold this position for 15 seconds. Let the band or tube slowly pull your foot back to the starting position. Repeat 5 times. Complete this exercise 2 times a day. Ankle eversion Sit on the floor with your legs straight out in front of you. Loop a rubber exercise band or tube around the ball of your left / right foot. The ball of your foot is on the walking surface, right under your toes. Hold the ends of the band in your hands, or secure the band to a stable object. The band or tube should be slightly tense when your foot is relaxed. Slowly push your foot outward, away from your other leg (eversion). Hold this position for 15 seconds. Slowly return your foot to the starting position. Repeat 5 times. Complete this exercise 2 times a day. Plantar flexion, standing  This exercise is sometimes called standing heel raise. Stand with your feet shoulder-width apart. Place your hands on a wall or table to steady yourself as needed, but try not to use it for support. Keep your weight spread evenly over the width of your feet while you slowly rise up on your toes (plantar flexion). If told by your health care provider: Shift your weight toward your left / right leg until you feel challenged. Stand on your left / right leg only. Hold this position for 15  seconds. Repeat 2 times. Complete this exercise 2 times a day. Single leg stand Without  shoes, stand near a railing or in a doorway. You may hold on to the railing or door frame as needed. Stand on your left / right foot. Keep your big toe down on the floor and try to keep your arch lifted. Do not roll to the outside of your foot. If this exercise is too easy, you can try it with your eyes closed or while standing on a pillow. Hold this position for 15 seconds. Repeat 5 times. Complete this exercise 2 times a day. This information is not intended to replace advice given to you by your health care provider. Make sure you discuss any questions you have with your health care provider. Document Revised: 06/04/2018 Document Reviewed: 06/04/2018 Elsevier Patient Education  2020 [employer].

## 2023-12-12 NOTE — Progress Notes (Signed)
  Subjective:  Patient ID: Sierra Arnold, female    DOB: August 07, 1985,  MRN: [id number]  Chief Complaint  Patient presents with   Tendonitis    RM 1 Rm 1 6 wk f/u recheck peroneal tendinitis. Pt states intermittent pain when resting and during activities. Pain rated as a 3/4 on a scale of 1-10.    37 y.o. female presents with the above complaint. History confirmed with patient.  Has noted some improvement.  The ankle feels weak.  Objective:  Physical Exam: warm, good capillary refill, no trophic changes or ulcerative lesions, normal DP and PT pulses, normal sensory exam, and today she has no pain distal to the fibula there is some pain posterior to the fibula along the peroneals and in the posterior ankle  Study Result  Narrative & Impression  MR ANKLE WITHOUT IV CONTRAST LEFT   COMPARISON: X-ray 5 09/17/1998   CLINICAL HISTORY: Clinical pain, tendon abnormality suspected. Negative x-ray.   PULSE SEQUENCES: Ax T1, Ax T2 FS, Sag T1, Sag T2 FS, Cor STIR, Ax T1 FS   FINDINGS:   Bones: There is no fracture or accelerated arthrosis mild subcutaneous edema is identified adjacent to the medial malleolus.   Ligaments: The anterior and posterior tibiofibular and talofibular ligaments are intact. Deltoid ligament and spring ligaments are intact. The sinus tarsi is unremarkable.   Musculotendinous structures: The tibialis anterior, extensor digitorum and extensor hallucis longus tendons are unremarkable. The posterior tibial tendon, flexor hallucis longus and flexor digitorum tendons are unremarkable. Peroneal tendons demonstrate no significant abnormality. No significant tenosynovitis or tendinosis. The Achilles tendon and plantar fascia are unremarkable.   IMPRESSION: Mild subcutaneous edema in the medial ankle. No significant bony, musculotendinous or ligamentous abnormality.   Electronically signed by: Norleen Satchel MD 12/06/2023 01:54 PM EDT RP Workstation:  [id number]     Assessment:   1. Peroneal tendinitis of left lower leg      Plan:  Patient was evaluated and treated and all questions answered.  Has had improvement after the injection.  She may discontinue the cam boot and return to regular shoe gear and a compression ankle at which I dispensed for her today.  I recommend physical therapy and referral was placed for this.  We will plan for scheduling her bunion surgery for next summer in a few months.  OTC Tylenol and/or ibuprofen as needed for pain and inflammation on the ankle.  We reviewed the MRI results which did not show any major tendon abnormality there is some thickening of the peroneus longus tendon but no major tenosynovitis or tearing.  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     Return in about 8 weeks (around 02/06/2024) for re-check peroneal tendinitis.

## 2023-12-13 ENCOUNTER — Ambulatory Visit: Admitting: Podiatry

## 2023-12-19 ENCOUNTER — Telehealth: Payer: Self-pay

## 2023-12-19 NOTE — Telephone Encounter (Signed)
 Orthotics are here Balance $0 Appt set for 10/23  Orthotics in [place] bin

## 2023-12-20 ENCOUNTER — Ambulatory Visit: Admitting: Podiatry

## 2023-12-20 DIAGNOSIS — M2142 Flat foot [pes planus] (acquired), left foot: Secondary | ICD-10-CM

## 2023-12-20 DIAGNOSIS — M7672 Peroneal tendinitis, left leg: Secondary | ICD-10-CM

## 2023-12-20 DIAGNOSIS — M2141 Flat foot [pes planus] (acquired), right foot: Secondary | ICD-10-CM

## 2023-12-20 NOTE — Progress Notes (Signed)
 Orthotics were dispensed they are functioning well no acute complaints.  Break-in period was discussed with the patient in extensive detail.  They were fitted in the shoes.  No complication noted.

## 2024-02-13 ENCOUNTER — Ambulatory Visit: Admitting: Podiatry

## 2024-02-18 ENCOUNTER — Ambulatory Visit (INDEPENDENT_AMBULATORY_CARE_PROVIDER_SITE_OTHER): Admitting: Podiatry

## 2024-02-18 VITALS — Ht 67.0 in | Wt 312.0 lb

## 2024-02-18 DIAGNOSIS — M7672 Peroneal tendinitis, left leg: Secondary | ICD-10-CM

## 2024-02-18 DIAGNOSIS — M21612 Bunion of left foot: Secondary | ICD-10-CM

## 2024-02-18 DIAGNOSIS — M2012 Hallux valgus (acquired), left foot: Secondary | ICD-10-CM

## 2024-02-19 ENCOUNTER — Encounter: Payer: Self-pay | Admitting: Podiatry

## 2024-02-19 NOTE — Progress Notes (Signed)
"  °  Subjective:  Patient ID: Sierra Arnold, female    DOB: Jul 05, 1985,  MRN: [id number]  Chief Complaint  Patient presents with   Tendonitis    Rm 7 Patient is here for f/u peroneal tendonitis of the left foot. Pt states some mild pain today 2/10. Pt states most days no pain or discomfort. Pt would like to discuss surgical option for left foot bunion.    37 y.o. female presents with the above complaint. History confirmed with patient.  Ankle is doing much better.  The bunion has been the most painful thing for her  Objective:  Physical Exam: warm, good capillary refill, no trophic changes or ulcerative lesions, normal DP and PT pulses, normal sensory exam, and today she has no pain along the peroneals she has hallux valgus deformity that is painful with good range of motion of the first MTP and hypermobility noted Study Result  Narrative & Impression  MR ANKLE WITHOUT IV CONTRAST LEFT   COMPARISON: X-ray 5 09/17/1998   CLINICAL HISTORY: Clinical pain, tendon abnormality suspected. Negative x-ray.   PULSE SEQUENCES: Ax T1, Ax T2 FS, Sag T1, Sag T2 FS, Cor STIR, Ax T1 FS   FINDINGS:   Bones: There is no fracture or accelerated arthrosis mild subcutaneous edema is identified adjacent to the medial malleolus.   Ligaments: The anterior and posterior tibiofibular and talofibular ligaments are intact. Deltoid ligament and spring ligaments are intact. The sinus tarsi is unremarkable.   Musculotendinous structures: The tibialis anterior, extensor digitorum and extensor hallucis longus tendons are unremarkable. The posterior tibial tendon, flexor hallucis longus and flexor digitorum tendons are unremarkable. Peroneal tendons demonstrate no significant abnormality. No significant tenosynovitis or tendinosis. The Achilles tendon and plantar fascia are unremarkable.   IMPRESSION: Mild subcutaneous edema in the medial ankle. No significant bony, musculotendinous or ligamentous  abnormality.   Electronically signed by: Norleen Satchel MD 12/06/2023 01:54 PM EDT RP Workstation: [id number]     Assessment:   1. Peroneal tendinitis of left lower leg   2. Hallux valgus with bunions, left      Plan:  Patient was evaluated and treated and all questions answered.  Improved significantly.  No further treatment needed for the peroneal tendons other than physical therapy as needed.  We also discussed her bunion deformity which is quite significant and painful for her and nonoperative treatment with wider shoes anti-inflammatories has not helped.  She would like to plan for surgery in the summer she will return to see me in February for surgical consultation and new x-rays.  Likely will need Lapidus procedure.    Return in about 2 months (around 04/20/2024) for bunion surgery planning visit (new left foot XR).   "

## 2024-04-28 ENCOUNTER — Ambulatory Visit: Admitting: Podiatry
